# Patient Record
Sex: Male | Born: 1990 | Race: White | Hispanic: Yes | Marital: Married | Smoking: Current every day smoker
Health system: Southern US, Community
[De-identification: ages and names within clinical notes are randomized; demographics above are authoritative.]

## PROBLEM LIST (undated history)

## (undated) DIAGNOSIS — L0591 Pilonidal cyst without abscess: Secondary | ICD-10-CM

## (undated) HISTORY — PX: WISDOM TOOTH EXTRACTION: SHX21

---
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
## 2010-03-17 ENCOUNTER — Ambulatory Visit: Payer: Self-pay | Admitting: Emergency Medicine

## 2010-03-17 DIAGNOSIS — L0591 Pilonidal cyst without abscess: Secondary | ICD-10-CM

## 2010-08-17 NOTE — Assessment & Plan Note (Signed)
Summary: Bump/Boil - Lower back/coccyx x 2-3 dys rm 4   Vital Signs:  Patient Profile:   20 Years Old Male CC:      Bump/boil - Lower back/coccyx x 2-3 dys Height:     64 inches Weight:      177 pounds O2 Sat:      100 % O2 treatment:    Room Air Temp:     97.6 degrees F oral Pulse rate:   91 / minute Pulse rhythm:   regular Resp:     16 per minute BP sitting:   117 / 72  (left arm) Cuff size:   regular  Vitals Entered By: Areta Haber CMA (March 17, 2010 5:15 PM)                  Current Allergies: No known allergies History of Present Illness Chief Complaint: Bump/boil - Lower back/coccyx x 2-3 dys History of Present Illness: Bump on his lower back.  He had a similar lesion in the past that drained spontaneously and he also tried to drain himself.  This one gets worse when he bumps it on something, like he did 2 days ago.  Now with redness, pain, swelling midline lower back.  No F/C/N/V, or other constitutional symptoms.  Not taking any OTC meds.  He is uninsured and voices concern over the cost of treatement.  Current Problems: PILONIDAL CYST (ICD-685.1)   Current Meds BACTRIM DS 800-160 MG TABS (SULFAMETHOXAZOLE-TRIMETHOPRIM) 1 tab by mouth two times a day for 10 days KEFLEX 500 MG CAPS (CEPHALEXIN) 1 tab by mouth two times a day for 10 days  REVIEW OF SYSTEMS Constitutional Symptoms      Denies fever, chills, night sweats, weight loss, weight gain, and fatigue.  Eyes       Denies change in vision, eye pain, eye discharge, glasses, contact lenses, and eye surgery. Ear/Nose/Throat/Mouth       Denies hearing loss/aids, change in hearing, ear pain, ear discharge, dizziness, frequent runny nose, frequent nose bleeds, sinus problems, sore throat, hoarseness, and tooth pain or bleeding.  Respiratory       Denies dry cough, productive cough, wheezing, shortness of breath, asthma, bronchitis, and emphysema/COPD.  Cardiovascular       Denies murmurs, chest pain, and  tires easily with exhertion.    Gastrointestinal       Denies stomach pain, nausea/vomiting, diarrhea, constipation, blood in bowel movements, and indigestion. Genitourniary       Denies painful urination, kidney stones, and loss of urinary control. Neurological       Denies paralysis, seizures, and fainting/blackouts. Musculoskeletal       Denies muscle pain, joint pain, joint stiffness, decreased range of motion, redness, swelling, muscle weakness, and gout.  Skin       Complains of unusual moles/lumps or sores.      Denies bruising and hair/skin or nail changes.      Comments: Lower back/coccyx x 2-3 dys Psych       Denies mood changes, temper/anger issues, anxiety/stress, speech problems, depression, and sleep problems.  Past History:  Past Medical History: Unremarkable  Past Surgical History: Denies surgical history  Social History: Single Current Smoker - 1/2 pack daily Alcohol use-yes - 2 a month Drug use-no Regular exercise-yes Smoking Status:  current Drug Use:  no Does Patient Exercise:  yes Physical Exam General appearance: well developed, well nourished, no acute distress Skin: Central sacral pilonidal cyst, swollen, erythematous, tender.  No active drainage.  No induration, possible mild fluctuance. Assessment New Problems: PILONIDAL CYST (ICD-685.1)   Plan New Medications/Changes: KEFLEX 500 MG CAPS (CEPHALEXIN) 1 tab by mouth two times a day for 10 days  #20 x 0, 03/17/2010, Hoyt Koch MD BACTRIM DS 800-160 MG TABS (SULFAMETHOXAZOLE-TRIMETHOPRIM) 1 tab by mouth two times a day for 10 days  #20 x 0, 03/17/2010, Hoyt Koch MD  New Orders: New Patient Level II [phone] Planning Comments:   Since money is an option and this is a new aggravation of this problem, we can try antibiotics and warm compresses first since this may be more of an irritated pilonidal disease rather than an actual cyst.  If pain continues, if fever, if worsening symptoms,  return to clinic for a vertical I&D incision with packing gauze.  At that time, we do not need to charge an extra copay.  Patient to also use Ibuprofen 400-600mg  Q6 hours as needed pain.  May also need to sit on an inflatable donut.  If it starts draining, do not squeeze and let it drain itself.  Patient understands and agrees to this course of action.   The patient and/or caregiver has been counseled thoroughly with regard to medications prescribed including dosage, schedule, interactions, rationale for use, and possible side effects and they verbalize understanding.  Diagnoses and expected course of recovery discussed and will return if not improved as expected or if the condition worsens. Patient and/or caregiver verbalized understanding.  Prescriptions: KEFLEX 500 MG CAPS (CEPHALEXIN) 1 tab by mouth two times a day for 10 days  #20 x 0   Entered and Authorized by:   Hoyt Koch MD   Signed by:   Hoyt Koch MD on 03/17/2010   Method used:   Print then Give to Patient   RxID:   [id number] BACTRIM DS 800-160 MG TABS (SULFAMETHOXAZOLE-TRIMETHOPRIM) 1 tab by mouth two times a day for 10 days  #20 x 0   Entered and Authorized by:   Hoyt Koch MD   Signed by:   Hoyt Koch MD on 03/17/2010   Method used:   Print then Give to Patient   RxID:   [id number]   Orders Added: 1)  New Patient Level II [97989]  Appended Document: Bump/Boil - Lower back/coccyx x 2-3 dys rm 4 F/U cll to pt - Per recording [phone] has been disconnected or no longer in service.

## 2015-06-16 ENCOUNTER — Ambulatory Visit (INDEPENDENT_AMBULATORY_CARE_PROVIDER_SITE_OTHER): Payer: BLUE CROSS/BLUE SHIELD | Admitting: Family Medicine

## 2015-06-16 ENCOUNTER — Encounter: Payer: Self-pay | Admitting: Family Medicine

## 2015-06-16 VITALS — BP 116/70 | HR 60 | Ht 65.0 in | Wt 182.0 lb

## 2015-06-16 DIAGNOSIS — L0591 Pilonidal cyst without abscess: Secondary | ICD-10-CM | POA: Diagnosis not present

## 2015-06-16 DIAGNOSIS — M26629 Arthralgia of temporomandibular joint, unspecified side: Secondary | ICD-10-CM | POA: Insufficient documentation

## 2015-06-16 DIAGNOSIS — M26622 Arthralgia of left temporomandibular joint: Secondary | ICD-10-CM

## 2015-06-16 DIAGNOSIS — Z23 Encounter for immunization: Secondary | ICD-10-CM | POA: Diagnosis not present

## 2015-06-16 NOTE — Patient Instructions (Signed)
Thank you for coming in today. You were seen today for your ear/jaw pain. 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 We recommend seeing a dentist to have a night guard made. Alternatively you can try an over the counter TMJ bite guard to see if there is improvement.   You were also seen for lower back pain due to a pilonidal cyst. We recommend consulting a surgeon for definitive treatment. We [facility] for [facility]. They should call you, but if you do not hear from them, call the number below.   [phone] [facility]   Please return for worsening or persistent symptoms.

## 2015-06-16 NOTE — Assessment & Plan Note (Signed)
Chronic, persistent. Most likely given recurrent drainage and location. Differential includes sebaceous cysts and abscess, though less likely given consistency of drainage and location as well as years of recurrence. Patient unlikely to benefit from incisional drainage in office given drained completely with palpation. Discussed definitive surgical management and referred to [facility].

## 2015-06-16 NOTE — Assessment & Plan Note (Signed)
Most likely source of jaw/ear pain given click with opening of jaw and history of grinding teeth. Recommend over the counter night guard for initial management with professional TMJ night guard made by dentist as the next step. Counseled on NSAID use to address acute pain. Follow up with persistent pain.

## 2015-06-16 NOTE — Progress Notes (Signed)
Collin SpenceLuis I Pace is a 24 y.o. male who [facility]  today for L ear pain and pilonidal cyst.   1) ear pain: Patient first noted L ear pain 2-3 weeks ago. He describes this as an itchy sensation within the ear. Concurrently he noted his jaw was locking up and he is unsure if the pain is in the ear or jaw. He notes this pain has been intermittent sharp pain anterior to the left ear every 4-5 hours. He notes he grinds his teeth at night and has not seen a dentist in 4-5 years. Pain at worst 3/10 but today 2/10, He has tried antibiotics but nothing else. Denies fevers, chills, drainage, and chewing gum.   2) pilonidal cyst: first noted 4-5 years ago. Notes that this cyst has always persists but will occasionally pop every 1-2 months when he bends over or bumps up against something. He notes it will hurt when it pops, and tracks into the lower lumbar area. He notes the cyst emits yellow, brown fluid. Patient notes it last popped a week ago. He received antibiotics for this 1 month ago, which helped it drain, but the cyst has recurred.    History reviewed. No pertinent past medical history. History reviewed. No pertinent past surgical history. Social History  Substance Use Topics  . Smoking status: Current Every Day Smoker  . Smokeless tobacco: Not on file  . Alcohol Use: 0.0 oz/week    0 Standard drinks or equivalent per week   family history is not on file.  ROS as above Medications: No current outpatient prescriptions on file.   No current facility-administered medications for this visit.   No Known Allergies   Exam:  BP 116/70 mmHg  Pulse 60  Ht 5\' 5"  (1.651 m)  Wt 182 lb (82.555 kg)  BMI 30.29 kg/m2 Gen: Well, non toxic man in NAD seated on exam table  HEENT: EOMI,  MMM, normal dentition with no sign of abscess or acute infection, non erythematous. Palpable click on left side with reproducible pain with opening mouth. Bilateral  clear external auditory canals, TM gray, translucent, bony landmarks appreciated, non-retracted/bulging, no erythema Lungs: Normal work of breathing Heart: RRR  Abd: Soft. Nondistended, Nontender Exts: Brisk capillary refill, warm and well perfused.  Skin: 1 cm in diameter papule 1cm left of the superior margin of the gluteal cleft with central punctutate lesion expressing brown pus when palpated superoinferiorly. Scarring noted at superior gluteal cleft.  The area is not particularly tender and there is no significant spreading erythema or induration. The fluid is small and drains well.  No results found for this or any previous visit (from the past 24 hour(s)). No results found.   Please see individual assessment and plan sections.

## 2015-12-21 ENCOUNTER — Encounter (HOSPITAL_COMMUNITY): Payer: Self-pay | Admitting: *Deleted

## 2015-12-23 NOTE — Progress Notes (Signed)
Contacted pt to review medication list following wisdom teeth extraction on 12/22/2015. Discussed with pt okay with a sip of water morning of surgery can take methylprednisone as prescribed, cephalaxen as prescribed, and hydrocodone-acetaminophen if needed. Pt verbalized understanding. Also aware of no food or drink after midnight.

## 2015-12-23 NOTE — H&P (Signed)
Collin Pace  Location: [facility] Patient #: [id number] DOB: [birth year] Married / Language: English / Race: Native Hawaiian or Other Pacific Islander Male  History of Present Illness   Patient words: reck.  The patient is a 25 year old male who presents with a pilonidal cyst.  His PCP is Dr. Denyse [facility] He is by himself.   He was supposed to call at the first of the year to schedule surgery, however there is some mixup which is unclear to me. He has had a couple flareups since I last saw him. He is now ready to schedule surgery. I gave him another sheet on pilonidal cystectomies. I explained the open procedure. I talked about the length of recovery which may take 6-12 weeks for the wound to heal. I also talked about possibly shaving the skin around the wound as it heals.          I reviewed with him the risk of surgery:  Infection, bleeding, nerve injury, and recurrence of the pilonidal.  History of pilonidal cyst (06/2015): The patient has had symptoms of a intergluteal knot and drainage from between his buttocks for about 5 years. This will go through a cycle of swelling up, then draining, then getting better. The cycle goes beteen several weeks to several months. He has personally mess with this area but has had no surgery on the pilonidal cyst.  Past Medical History: 1. Smokes - he knows that this is bad for his health.  Social History: Married. 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[place] Has 25 yo   Problem List/Past Medical (Kandis Cockingavid H Nikkolas Coomes, MD; 11/12/2015 1:47 PM) PILONIDAL CYST (L05.91)  Allergies (Sonya Bynum, CMA; 11/12/2015 4:20 PM) No Known Drug Allergies12/14/2016  Medication History (Sonya Bynum, CMA; 11/12/2015 4:20 PM) No Current Medications Medications Reconciled  Vitals (Sonya Bynum CMA; 11/12/2015 4:20 PM) 11/12/2015 4:20 PM Weight: 178 lb Height: 65in Body Surface Area: 1.88 m Body Mass Index:  29.62 kg/m  Temp.: 77F(Temporal)  Pulse: 75 (Regular)  BP: 118/70 (Sitting, Left Arm, Standard)   Physical Exam  General: WN M alert and generally healthy appearing. A little quiet. HEENT: Normal. Pupils equal.  Neck: Supple. No mass. No thyroid mass.   Abdomen: Soft. No mass. No tenderness. No hernia. Normal bowel sounds. No abdominal scars.  Intragluteal area. Several puctums covering almost 10 cm. It looks longer than what I remembe when I last saw him.  I drew the length of the incision (punctums) for him, so he is aware of how large the incision is going to be  Extremities: Good strength and ROM in upper and lower extremities.  Neurologic: Grossly intact to motor and sensory function. Psychiatric: Has normal mood and affect. Behavior is normal.   Assessment & Plan  1.  PILONIDAL CYST (L05.91)  Plan:   1) To schedule pilonidal cyst excision  2. Smokes - he knows that this is bad for his health.  We talked about this delaying wound healing and possibly contributing to recurrence of the pilonidal.  Ovidio Kinavid Torianna Junio, MD, [facility] Pager: [phone] Office phone:  [phone]

## 2015-12-24 ENCOUNTER — Ambulatory Visit (HOSPITAL_COMMUNITY)
Admission: RE | Admit: 2015-12-24 | Discharge: 2015-12-24 | Disposition: A | Discharge status: Home / Self Care | Payer: BLUE CROSS/BLUE SHIELD | Source: Ambulatory Visit | Attending: Surgery | Admitting: Surgery

## 2015-12-24 ENCOUNTER — Encounter (HOSPITAL_COMMUNITY): Payer: Self-pay

## 2015-12-24 ENCOUNTER — Ambulatory Visit (HOSPITAL_COMMUNITY): Payer: BLUE CROSS/BLUE SHIELD | Admitting: Registered Nurse

## 2015-12-24 ENCOUNTER — Ambulatory Visit: Payer: Self-pay | Admitting: Surgery

## 2015-12-24 ENCOUNTER — Encounter (HOSPITAL_COMMUNITY)
Admission: RE | Disposition: A | Discharge status: Home / Self Care | Payer: Self-pay | Source: Ambulatory Visit | Attending: Surgery

## 2015-12-24 DIAGNOSIS — L0591 Pilonidal cyst without abscess: Secondary | ICD-10-CM | POA: Diagnosis present

## 2015-12-24 DIAGNOSIS — F17200 Nicotine dependence, unspecified, uncomplicated: Secondary | ICD-10-CM | POA: Insufficient documentation

## 2015-12-24 HISTORY — DX: Pilonidal cyst without abscess: L05.91

## 2015-12-24 HISTORY — PX: PILONIDAL CYST EXCISION: SHX744

## 2015-12-24 LAB — HEMOGLOBIN: Hemoglobin: 13.8 g/dL (ref 13.0–17.0)

## 2015-12-24 SURGERY — EXCISION, PILONIDAL CYST, EXTENSIVE
Anesthesia: General

## 2015-12-24 MED ORDER — HYDROCODONE-ACETAMINOPHEN 5-325 MG PO TABS: 1.0000 | ORAL_TABLET | Freq: Four times a day (QID) | ORAL | Status: AC | PRN

## 2015-12-24 MED ORDER — CEFAZOLIN SODIUM-DEXTROSE 2-4 GM/100ML-% IV SOLN
2.0000 g | INTRAVENOUS | Status: AC
  Administered 2015-12-24: 2 g via INTRAVENOUS
  Filled 2015-12-24: qty 100

## 2015-12-24 MED ORDER — CHLORHEXIDINE GLUCONATE 4 % EX LIQD: 60.0000 mL | Freq: Once | CUTANEOUS | Status: DC

## 2015-12-24 MED ORDER — FENTANYL CITRATE (PF) 250 MCG/5ML IJ SOLN
INTRAMUSCULAR | Status: AC
  Filled 2015-12-24: qty 5

## 2015-12-24 MED ORDER — HYDROCODONE-ACETAMINOPHEN 5-325 MG PO TABS
1.0000 | ORAL_TABLET | ORAL | Status: DC | PRN
  Administered 2015-12-24: 1 via ORAL
  Filled 2015-12-24: qty 1

## 2015-12-24 MED ORDER — BUPIVACAINE HCL (PF) 0.25 % IJ SOLN
INTRAMUSCULAR | Status: DC | PRN
  Administered 2015-12-24: 30 mL

## 2015-12-24 MED ORDER — METHYLENE BLUE 0.5 % INJ SOLN
INTRAVENOUS | Status: AC
  Filled 2015-12-24: qty 10

## 2015-12-24 MED ORDER — PROPOFOL 10 MG/ML IV BOLUS
INTRAVENOUS | Status: AC
Start: 2015-12-24 — End: 2015-12-24
  Filled 2015-12-24: qty 20

## 2015-12-24 MED ORDER — METHYLENE BLUE (ANTIDOTE) 1 % IJ SOLN
INTRAMUSCULAR | Status: DC | PRN
  Administered 2015-12-24: 1 mL via INTRAMUSCULAR

## 2015-12-24 MED ORDER — PROPOFOL 10 MG/ML IV BOLUS
INTRAVENOUS | Status: DC | PRN
  Administered 2015-12-24: 160 mg via INTRAVENOUS
  Administered 2015-12-24: 50 mg via INTRAVENOUS

## 2015-12-24 MED ORDER — SUGAMMADEX SODIUM 200 MG/2ML IV SOLN
INTRAVENOUS | Status: AC
  Filled 2015-12-24: qty 2

## 2015-12-24 MED ORDER — SUCCINYLCHOLINE CHLORIDE 20 MG/ML IJ SOLN
INTRAMUSCULAR | Status: DC | PRN
  Administered 2015-12-24: 100 mg via INTRAVENOUS

## 2015-12-24 MED ORDER — ONDANSETRON HCL 4 MG/2ML IJ SOLN
INTRAMUSCULAR | Status: DC | PRN
  Administered 2015-12-24: 4 mg via INTRAVENOUS

## 2015-12-24 MED ORDER — LIDOCAINE HCL (CARDIAC) 20 MG/ML IV SOLN
INTRAVENOUS | Status: AC
  Filled 2015-12-24: qty 5

## 2015-12-24 MED ORDER — LACTATED RINGERS IV SOLN
INTRAVENOUS | Status: DC | PRN
  Administered 2015-12-24: 10:00:00 via INTRAVENOUS

## 2015-12-24 MED ORDER — 0.9 % SODIUM CHLORIDE (POUR BTL) OPTIME
TOPICAL | Status: DC | PRN
Start: 2015-12-24 — End: 2015-12-24
  Administered 2015-12-24: 1000 mL

## 2015-12-24 MED ORDER — MIDAZOLAM HCL 5 MG/5ML IJ SOLN
INTRAMUSCULAR | Status: DC | PRN
  Administered 2015-12-24: 2 mg via INTRAVENOUS

## 2015-12-24 MED ORDER — SUGAMMADEX SODIUM 200 MG/2ML IV SOLN
INTRAVENOUS | Status: DC | PRN
Start: 2015-12-24 — End: 2015-12-24
  Administered 2015-12-24: 160 mg via INTRAVENOUS

## 2015-12-24 MED ORDER — MIDAZOLAM HCL 2 MG/2ML IJ SOLN
INTRAMUSCULAR | Status: AC
  Filled 2015-12-24: qty 2

## 2015-12-24 MED ORDER — ROCURONIUM BROMIDE 50 MG/5ML IV SOLN
INTRAVENOUS | Status: AC
  Filled 2015-12-24: qty 1

## 2015-12-24 MED ORDER — FENTANYL CITRATE (PF) 100 MCG/2ML IJ SOLN: 25.0000 ug | INTRAMUSCULAR | Status: DC | PRN

## 2015-12-24 MED ORDER — PROPOFOL 10 MG/ML IV BOLUS
INTRAVENOUS | Status: AC
  Filled 2015-12-24: qty 20

## 2015-12-24 MED ORDER — BUPIVACAINE HCL (PF) 0.25 % IJ SOLN
INTRAMUSCULAR | Status: AC
  Filled 2015-12-24: qty 30

## 2015-12-24 MED ORDER — CEFAZOLIN SODIUM-DEXTROSE 2-4 GM/100ML-% IV SOLN
INTRAVENOUS | Status: AC
Start: 2015-12-24 — End: 2015-12-24
  Filled 2015-12-24: qty 100

## 2015-12-24 MED ORDER — LIDOCAINE HCL (CARDIAC) 20 MG/ML IV SOLN
INTRAVENOUS | Status: DC | PRN
  Administered 2015-12-24: 100 mg via INTRAVENOUS

## 2015-12-24 MED ORDER — ONDANSETRON HCL 4 MG/2ML IJ SOLN
INTRAMUSCULAR | Status: AC
  Filled 2015-12-24: qty 2

## 2015-12-24 MED ORDER — ROCURONIUM BROMIDE 100 MG/10ML IV SOLN
INTRAVENOUS | Status: DC | PRN
  Administered 2015-12-24: 20 mg via INTRAVENOUS

## 2015-12-24 MED ORDER — FENTANYL CITRATE (PF) 100 MCG/2ML IJ SOLN
INTRAMUSCULAR | Status: DC | PRN
  Administered 2015-12-24 (×2): 100 ug via INTRAVENOUS

## 2015-12-24 SURGICAL SUPPLY — 28 items
BLADE CLIPPER SURG (BLADE) ×3 IMPLANT
BNDG GAUZE ELAST 4 BULKY (GAUZE/BANDAGES/DRESSINGS) ×3 IMPLANT
COVER SURGICAL LIGHT HANDLE (MISCELLANEOUS) IMPLANT
DRAPE LAPAROTOMY T 102X78X121 (DRAPES) ×3 IMPLANT
ELECT REM PT RETURN 9FT ADLT (ELECTROSURGICAL) ×3
ELECTRODE REM PT RTRN 9FT ADLT (ELECTROSURGICAL) ×1 IMPLANT
GAUZE SPONGE 4X4 12PLY STRL (GAUZE/BANDAGES/DRESSINGS) ×3 IMPLANT
GLOVE BIO SURGEON STRL SZ7 (GLOVE) IMPLANT
GLOVE BIO SURGEON STRL SZ7.5 (GLOVE) ×3 IMPLANT
GLOVE BIOGEL PI IND STRL 7.0 (GLOVE) IMPLANT
GLOVE BIOGEL PI IND STRL 7.5 (GLOVE) ×1 IMPLANT
GLOVE BIOGEL PI INDICATOR 7.0 (GLOVE)
GLOVE BIOGEL PI INDICATOR 7.5 (GLOVE) ×2
GLOVE EUDERMIC 7 POWDERFREE (GLOVE) IMPLANT
GLOVE SURG SIGNA 7.5 PF LTX (GLOVE) ×3 IMPLANT
GOWN SPEC L4 XLG W/TWL (GOWN DISPOSABLE) ×3 IMPLANT
GOWN STRL REUS W/ TWL XL LVL3 (GOWN DISPOSABLE) ×3 IMPLANT
GOWN STRL REUS W/TWL LRG LVL3 (GOWN DISPOSABLE) ×3 IMPLANT
GOWN STRL REUS W/TWL XL LVL3 (GOWN DISPOSABLE) ×9
KIT BASIN OR (CUSTOM PROCEDURE TRAY) ×3 IMPLANT
NEEDLE BLUNT 17GA (NEEDLE) ×3 IMPLANT
NEEDLE HYPO 22GX1.5 SAFETY (NEEDLE) ×3 IMPLANT
PACK GENERAL/GYN (CUSTOM PROCEDURE TRAY) ×3 IMPLANT
PAD ABD 8X10 STRL (GAUZE/BANDAGES/DRESSINGS) ×3 IMPLANT
SYR CONTROL 10ML LL (SYRINGE) ×6 IMPLANT
TAPE CLOTH SURG 4X10 WHT LF (GAUZE/BANDAGES/DRESSINGS) ×3 IMPLANT
TOWEL OR 17X26 10 PK STRL BLUE (TOWEL DISPOSABLE) ×3 IMPLANT
UNDERPAD 30X30 INCONTINENT (UNDERPADS AND DIAPERS) ×3 IMPLANT

## 2015-12-24 NOTE — Discharge Instructions (Signed)
[facility] - DISCHARGE INSTRUCTIONS TO PATIENT  Return to work on:  Will talk about this when I see him back  Activity:  Driving - May drive in 3 or 4 days, if doing well.  It taking pain meds, should not drive   Lifting - No lifting more than 15 pounds for 1 week, then no limit  Wound Care:   Remove packing tomorrow and start sitz bath.  Do the sitz baths at least twice a day.        Sitz bath is sitting in warm water in tub for 15 minutes (the water should be a comfortable temperature)  Diet:  As tolerated.  Follow up appointment:  Call Dr. Allene PyoNewman's office [facility] at [phone] for an appointment in next week.  Medications and dosages:  Resume your home medications.  You have a prescription for:  Vicodin.  Call Dr. Ezzard StandingNewman or his office  [phone]) if you have:  Temperature greater than 100.4,  Persistent nausea and vomiting,  Severe uncontrolled pain,  Redness, tenderness, or signs of infection (pain, swelling, redness, odor or green/yellow discharge around the site),  Difficulty breathing, headache or visual disturbances,  Any other questions or concerns you may have after discharge.  In an emergency, call 911 or go to an Emergency Department at a nearby hospital.   General Anesthesia, Adult, Care After Refer to this sheet in the next few weeks. These instructions provide you with information on caring for yourself after your procedure. Your health care provider may also give you more specific instructions. Your treatment has been planned according to current medical practices, but problems sometimes occur. Call your health care provider if you have any problems or questions after your procedure. WHAT TO EXPECT AFTER THE PROCEDURE After the procedure, it is typical to experience:  Sleepiness.  Nausea and vomiting. HOME CARE INSTRUCTIONS  For the first 24 hours after general anesthesia:  Have a responsible person with you.  Do not  drive a car. If you are alone, do not take public transportation.  Do not drink alcohol.  Do not take medicine that has not been prescribed by your health care provider.  Do not sign important papers or make important decisions.  You may resume a normal diet and activities as directed by your health care provider.  If you have questions or problems that seem related to general anesthesia, call the hospital and ask for the anesthetist or anesthesiologist on call. SEEK MEDICAL CARE IF:  You have nausea and vomiting that continue the day after anesthesia.  You develop a rash. SEEK IMMEDIATE MEDICAL CARE IF:   You have difficulty breathing.  You have chest pain.  You have any allergic problems.   This information is not intended to replace advice given to you by your health care provider. Make sure you discuss any questions you have with your health care provider.   Document Released: 10/10/2000 Document Revised: 07/25/2014 Document Reviewed: 11/02/2011 Elsevier Interactive Patient Education [employer]2016 Elsevier Inc.

## 2015-12-24 NOTE — Anesthesia Preprocedure Evaluation (Addendum)
Anesthesia Evaluation  Patient identified by MRN, date of birth, ID band Patient awake    Reviewed: Allergy & Precautions, H&P , Patient's Chart, lab work & pertinent test results, reviewed documented beta blocker date and time   Airway Mallampati: II  TM Distance: >3 FB Neck ROM: full    Dental no notable dental hx.    Pulmonary Current Smoker,    Pulmonary exam normal breath sounds clear to auscultation       Cardiovascular  Rhythm:regular Rate:Normal     Neuro/Psych    GI/Hepatic   Endo/Other    Renal/GU      Musculoskeletal   Abdominal   Peds  Hematology   Anesthesia Other Findings   Reproductive/Obstetrics                           Anesthesia Physical Anesthesia Plan  ASA: II  Anesthesia Plan: General   Post-op Pain Management:    Induction: Intravenous  Airway Management Planned: Oral ETT and Video Laryngoscope Planned  Additional Equipment:   Intra-op Plan:   Post-operative Plan: Extubation in OR  Informed Consent: I have reviewed the patients History and Physical, chart, labs and discussed the procedure including the risks, benefits and alternatives for the proposed anesthesia with the patient or authorized representative who has indicated his/her understanding and acceptance.   Dental Advisory Given and Dental advisory given  Plan Discussed with: CRNA and Surgeon  Anesthesia Plan Comments: (Recently had his wisdom teeth removed; will have glidescope available. Aperture adequate  Discussed general anesthesia, including possible nausea, instrumentation of airway, sore throat,pulmonary aspiration, etc. I asked if the were any outstanding questions, or  concerns before we proceeded. )       Anesthesia Quick Evaluation

## 2015-12-24 NOTE — Op Note (Signed)
12/24/2015  11:49 AM  PATIENT:  Collin Pace, 25 y.o., male, MRN: [id number]  PREOP DIAGNOSIS:  Pilonidal cyst   POSTOP DIAGNOSIS:   Pilonidal cyst  (photos at the end of then note)  PROCEDURE:   Procedure(s):  PILONIDAL CYSTECTOMY   SURGEON:   Ovidio Kinavid Brighton Delio, M.D.  ASSISTANT:   None  ANESTHESIA:   general  Anesthesiologist: Cristela BlueKyle Jackson, MD CRNA: Elisabeth CaraLacey A Armistead, CRNA  General  EBL:  75  ml  DRAINS: none   LOCAL MEDICATIONS USED:   30 cc of 1/4% Marcaine  SPECIMEN:   Pilonidal cyst  COUNTS CORRECT:  YES  INDICATIONS FOR PROCEDURE:  Collin Pace is a 25 y.o. (DOB: [birth year])  male whose primary care physician is Clementeen GrahamEvan Corey, MD and comes for excision of pilonidal cyst.   The indications and risks of the surgery were explained to the patient.  The risks include, but are not limited to, infection, bleeding, and nerve injury.  He understands that I will leave the wound open.  PROCEDURE:   The patient [facility] and given a general anesthetic.  He was rolled to a prone position.  His buttocks was shaved and he was his buttocks was taped apart.  A time out was held and the surgical check list run.  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  The cranial part of the wound and the left side of the wound had had prior infections.  I injected the sinus with methyline blue.  I thin excised the tract/sinus in its entirety.  I did not cut across the tract.  I irrigated the wound with saline.  I infiltrated 30 cc of 1/4% marcaine into the wound.  I then waited 15 minutes with packing in the wound to allow any bleeding or problems.  There was none.  I then packed the wound with saline gauze.  He was transferred to the recovery room in good condition.  The sponge and needle count were correct at the end of the case.    Pre op pilonidal puncta   Post op wound    Ovidio Kinavid Nohea Kras, MD, [facility] Pager:  [phone] Office phone:  [phone]

## 2015-12-24 NOTE — Anesthesia Procedure Notes (Signed)
Procedure Name: Intubation Date/Time: 12/24/2015 10:30 AM Performed by: Jarvis NewcomerARMISTEAD, Collin Pace Pre-anesthesia Checklist: Patient identified, Emergency Drugs available, Suction available, Patient being monitored and Timeout performed Patient Re-evaluated:Patient Re-evaluated prior to inductionOxygen Delivery Method: Circle system utilized Preoxygenation: Pre-oxygenation with 100% oxygen Intubation Type: IV induction Laryngoscope Size: Miller and 2 Grade View: Grade I Tube type: Oral Tube size: 7.5 mm Number of attempts: 1 Airway Equipment and Method: Stylet Placement Confirmation: ETT inserted through vocal cords under direct vision,  positive ETCO2 and breath sounds checked- equal and bilateral Secured at: 23 cm Tube secured with: Tape Dental Injury: Teeth and Oropharynx as per pre-operative assessment

## 2015-12-24 NOTE — Interval H&P Note (Signed)
History and Physical Interval Note:  12/24/2015 10:05 AM  Collin Pace  has presented today for surgery, with the diagnosis of Pilonidal cyst   The various methods of treatment have been discussed with the patient and family.  Wife is with him.  We discussed returning to work.  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    After consideration of risks, benefits and other options for treatment, the patient has consented to  Procedure(s):  PILONIDAL CYSTECTOMY  (N/A) as a surgical intervention .  The patient's history has been reviewed, patient examined, no change in status, stable for surgery.  I have reviewed the patient's chart and labs.  Questions were answered to the patient's satisfaction.     Julieth Tugman H

## 2015-12-24 NOTE — Transfer of Care (Signed)
Immediate Anesthesia Transfer of Care Note  Patient: Collin Pace  Procedure(s) Performed: Procedure(s):  PILONIDAL CYSTECTOMY  (N/A)  Patient Location: PACU  Anesthesia Type:General  Level of Consciousness: awake, alert , oriented and patient cooperative  Airway & Oxygen Therapy: Patient Spontanous Breathing and Patient connected to face mask oxygen  Post-op Assessment: Report given to RN, Post -op Vital signs reviewed and stable and Patient moving all extremities  Post vital signs: Reviewed and stable  Last Vitals:  Filed Vitals:   12/24/15 0837  BP: 109/68  Pulse: 51  Temp: 36.6 C  Resp: 16    Last Pain:  Filed Vitals:   12/24/15 0912  PainSc: 3       Patients Stated Pain Goal: 4 (12/24/15 0908)  Complications: No apparent anesthesia complications

## 2015-12-24 NOTE — Anesthesia Postprocedure Evaluation (Signed)
Anesthesia Post Note  Patient: Collin Pace  Procedure(s) Performed: Procedure(s) (LRB):  PILONIDAL CYSTECTOMY  (N/A)  Patient location during evaluation: PACU Anesthesia Type: General Level of consciousness: sedated Pain management: satisfactory to patient Vital Signs Assessment: post-procedure vital signs reviewed and stable Respiratory status: spontaneous breathing Cardiovascular status: stable Anesthetic complications: no    Last Vitals:  Filed Vitals:   12/24/15 1300 12/24/15 1315  BP: 107/69 105/58  Pulse: 57 52  Temp: 36.8 C 36.7 C  Resp: 15 14    Last Pain:  Filed Vitals:   12/24/15 1316  PainSc: 0-No pain                 Rocio Wolak EDWARD

## 2015-12-25 MED FILL — Methylene Blue IV Soln 50 MG/10ML (5 MG/ML): INTRAVENOUS | Qty: 10 | Status: AC

## 2016-01-27 ENCOUNTER — Encounter (HOSPITAL_COMMUNITY): Payer: Self-pay | Admitting: *Deleted

## 2016-01-27 ENCOUNTER — Emergency Department (HOSPITAL_COMMUNITY)
Admission: EM | Admit: 2016-01-27 | Discharge: 2016-01-27 | Disposition: A | Discharge status: Home / Self Care | Payer: BLUE CROSS/BLUE SHIELD | Attending: Emergency Medicine | Admitting: Emergency Medicine

## 2016-01-27 DIAGNOSIS — Y939 Activity, unspecified: Secondary | ICD-10-CM | POA: Diagnosis not present

## 2016-01-27 DIAGNOSIS — F17210 Nicotine dependence, cigarettes, uncomplicated: Secondary | ICD-10-CM | POA: Insufficient documentation

## 2016-01-27 DIAGNOSIS — Y999 Unspecified external cause status: Secondary | ICD-10-CM | POA: Insufficient documentation

## 2016-01-27 DIAGNOSIS — S29012A Strain of muscle and tendon of back wall of thorax, initial encounter: Secondary | ICD-10-CM | POA: Insufficient documentation

## 2016-01-27 DIAGNOSIS — Y92410 Unspecified street and highway as the place of occurrence of the external cause: Secondary | ICD-10-CM | POA: Diagnosis not present

## 2016-01-27 DIAGNOSIS — S299XXA Unspecified injury of thorax, initial encounter: Secondary | ICD-10-CM | POA: Diagnosis present

## 2016-01-27 DIAGNOSIS — T148XXA Other injury of unspecified body region, initial encounter: Secondary | ICD-10-CM

## 2016-01-27 MED ORDER — CYCLOBENZAPRINE HCL 10 MG PO TABS: 10.0000 mg | ORAL_TABLET | Freq: Two times a day (BID) | ORAL | Status: AC | PRN

## 2016-01-27 NOTE — Discharge Instructions (Signed)
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
## 2016-01-27 NOTE — ED Notes (Signed)
Declined W/C at D/C and was escorted to lobby by RN. 

## 2020-07-30 ENCOUNTER — Encounter (HOSPITAL_COMMUNITY): Payer: Self-pay | Admitting: Emergency Medicine

## 2020-07-30 ENCOUNTER — Emergency Department (HOSPITAL_COMMUNITY): Payer: No Typology Code available for payment source

## 2020-07-30 ENCOUNTER — Emergency Department (HOSPITAL_COMMUNITY)
Admission: EM | Admit: 2020-07-30 | Discharge: 2020-07-31 | Disposition: A | Discharge status: Home / Self Care | Payer: No Typology Code available for payment source | Attending: Emergency Medicine | Admitting: Emergency Medicine

## 2020-07-30 ENCOUNTER — Other Ambulatory Visit: Payer: Self-pay

## 2020-07-30 DIAGNOSIS — S62024A Nondisplaced fracture of middle third of navicular [scaphoid] bone of right wrist, initial encounter for closed fracture: Secondary | ICD-10-CM | POA: Insufficient documentation

## 2020-07-30 DIAGNOSIS — F17210 Nicotine dependence, cigarettes, uncomplicated: Secondary | ICD-10-CM | POA: Insufficient documentation

## 2020-07-30 DIAGNOSIS — Y990 Civilian activity done for income or pay: Secondary | ICD-10-CM | POA: Insufficient documentation

## 2020-07-30 DIAGNOSIS — W208XXA Other cause of strike by thrown, projected or falling object, initial encounter: Secondary | ICD-10-CM | POA: Diagnosis not present

## 2020-07-30 DIAGNOSIS — S6991XA Unspecified injury of right wrist, hand and finger(s), initial encounter: Secondary | ICD-10-CM | POA: Diagnosis present

## 2020-07-30 NOTE — ED Provider Notes (Signed)
[facility] EMERGENCY DEPARTMENT Provider Note   CSN: [id number] Arrival date & time: 07/30/20  1841     History Chief Complaint  Patient presents with  . Wrist Pain    Collin Pace is a 30 y.o. male.  HPI 30 year old male presents to the emergency department today for evaluation of right wrist pain.  Patient reports 2 years ago he had an injury at work where something fell onto the right wrist.  Patient reports he just took ibuprofen and did not get imaging at that time.  Patient reports that certain movements makes the pain worse.  No active pain at this time.  Denies any numbness or tingling.  Is taken no medications for pain prior to arrival today.  Patient has been wearing a splint at times that does not seem to help with the symptoms. No new injuries.    Past Medical History:  Diagnosis Date  . Pilonidal cyst     Patient Active Problem List   Diagnosis Date Noted  . Temporomandibular joint (TMJ) pain 06/16/2015  . Pilonidal cyst 03/17/2010    Past Surgical History:  Procedure Laterality Date  . PILONIDAL CYST EXCISION N/A 12/24/2015   Procedure:  PILONIDAL CYSTECTOMY ;  Surgeon: Ovidio Kin, MD;  Location: [facility];  Service: General;  Laterality: N/A;  . WISDOM TOOTH EXTRACTION     4-to be removed on 12/22/2015       No family history on file.  Social History   Tobacco Use  . Smoking status: Current Every Day Smoker    Packs/day: 0.50    Years: 8.00    Pack years: 4.00    Types: Cigarettes  . Smokeless tobacco: Current User    Types: Snuff  Substance Use Topics  . Alcohol use: Yes    Alcohol/week: 0.0 standard drinks    Comment: 1 weekend a month  . Drug use: No    Home Medications Prior to Admission medications   Medication Sig Start Date End Date Taking? Authorizing Provider  cephALEXin (KEFLEX) 500 MG capsule Take 1,000 mg by mouth daily.    [provider]  cyclobenzaprine (FLEXERIL) 10 MG tablet Take 1 tablet  (10 mg total) by mouth 2 (two) times daily as needed for muscle spasms. 01/27/16   Roxy Horseman, PA-C  HYDROcodone-acetaminophen (NORCO) 7.5-325 MG tablet Take 1 tablet by mouth every 4 (four) hours as needed for moderate pain (can take every 4 to 6 hours as needed).    [provider]  HYDROcodone-acetaminophen (NORCO/VICODIN) 5-325 MG tablet Take 1-2 tablets by mouth every 6 (six) hours as needed. 12/24/15   Ovidio Kin, MD  ibuprofen (ADVIL,MOTRIN) 800 MG tablet Take 800 mg by mouth every 8 (eight) hours as needed (can take every 6 to 8 hours as needed for pain).    [provider]  methylPREDNISolone (MEDROL DOSEPAK) 4 MG TBPK tablet Take 4 mg by mouth. 12/23/15   [provider]    Allergies    Patient has no known allergies.  Review of Systems   Review of Systems  Constitutional: Negative for chills and fever.  HENT: Negative for congestion.   Eyes: Negative for discharge.  Respiratory: Negative for cough.   Gastrointestinal: Negative for vomiting.  Musculoskeletal: Positive for arthralgias and myalgias. Negative for joint swelling.  Skin: Negative for color change.  Neurological: Negative for weakness and numbness.    Physical Exam Updated Vital Signs BP 118/78 (BP Location: Left Arm)   Pulse 73  Temp 98.6 F (37 C) (Oral)   Resp 16   Ht 5\' 5"  (1.651 m)   Wt 81.6 kg   SpO2 99%   BMI 29.95 kg/m   Physical Exam Vitals and nursing note reviewed.  Constitutional:      General: He is not in acute distress.    Appearance: He is well-developed and well-nourished. He is not ill-appearing or toxic-appearing.  HENT:     Head: Normocephalic and atraumatic.  Eyes:     General: No scleral icterus.       Right eye: No discharge.        Left eye: No discharge.  Cardiovascular:     Pulses: Normal pulses.  Pulmonary:     Effort: No respiratory distress.  Musculoskeletal:        General: Normal range of motion.     Cervical back: Normal range of  motion.     Comments: Patient has full range of motion of the right wrist.  Does have some pain with extension of the wrist.  Radial pulses are 2+.  Negative Tinel's sign.  No erythema or edema noted.  No warmth.  No ecchymosis.  Skin:    Coloration: Skin is not pale.  Neurological:     Mental Status: He is alert.     Sensory: No sensory deficit.     Motor: No weakness.  Psychiatric:        Behavior: Behavior normal.        Thought Content: Thought content normal.        Judgment: Judgment normal.     ED Results / Procedures / Treatments   Labs (all labs ordered are listed, but only abnormal results are displayed) Labs Reviewed - No data to display  EKG None  Radiology DG Wrist Complete Right  Result Date: 07/30/2020 CLINICAL DATA:  Fall, wrist pain EXAM: RIGHT WRIST - COMPLETE 3+ VIEW COMPARISON:  None. FINDINGS: There is a fracture through the mid pole of the right scaphoid. No subluxation or dislocation. No additional fracture. Soft tissues are intact. IMPRESSION: Midpole scaphoid fracture. Electronically Signed   By: 08/01/2020 M.D.   On: 07/30/2020 23:55    Procedures Procedures (including critical care time)  Medications Ordered in ED Medications - No data to display  ED Course  I have reviewed the triage vital signs and the nursing notes.  Pertinent labs & imaging results that were available during my care of the patient were reviewed by me and considered in my medical decision making (see chart for details).    MDM Rules/Calculators/A&P                          30 year old male presents the ER for intermittent right wrist pain.  Patient reports that he injured his wrist in an accident about a year and a half ago.  Never received any imaging at that time.  Patient reports since then has had intermittent pain with certain movements.  Patient neurovascularly intact.  X-rays reviewed myself today.  Patient does have a scaphoid fracture.  Patient placed into a thumb  spica splint.  Given hand follow-up.  Discussed splint care at home.  Discussed return precautions with patient.  Pt is hemodynamically stable, in NAD, & able to ambulate in the ED. Evaluation does not show pathology that would require ongoing emergent intervention or inpatient treatment. I explained the diagnosis to the patient. Pain has been managed & has no complaints prior  to dc. Pt is comfortable with above plan and is stable for discharge at this time. All questions were answered prior to disposition. Strict return precautions for f/u to the ED were discussed. Encouraged follow up with PCP.  Final Clinical Impression(s) / ED Diagnoses Final diagnoses:  Closed nondisplaced fracture of middle third of scaphoid bone of right wrist, initial encounter    Rx / DC Orders ED Discharge Orders    None       Rise Mu, PA-C 07/31/20 0014    Gilda Crease, MD 07/31/20 [phone]

## 2020-07-30 NOTE — ED Triage Notes (Addendum)
Patient complains of wrist pain that started approximately two years ago after an incident at work. States the wrist has bothered him since the incident. Denies any new or recent injury.

## 2020-07-31 NOTE — ED Notes (Signed)
Ortho tech called 

## 2020-07-31 NOTE — ED Notes (Signed)
Patient verbalizes understanding of discharge instructions. Opportunity for questioning and answers were provided. Armband removed by staff, pt discharged from ED ambulatory.   

## 2020-07-31 NOTE — Progress Notes (Signed)
Orthopedic Tech Progress Note Patient Details:  WES LEZOTTE 1990-08-11 [id number]  Ortho Devices Type of Ortho Device: Thumb spica splint Ortho Device/Splint Location: rue Ortho Device/Splint Interventions: Ordered,Application,Adjustment   Post Interventions Patient Tolerated: Well Instructions Provided: Care of device,Poper ambulation with device   Tiffany Talarico 07/31/2020, 4:31 AM

## 2020-07-31 NOTE — Discharge Instructions (Signed)
Motrin and tylenol as needed for pain. Ice affected area (see instructions below).  °Please call the orthopedic physician listed today or first thing in the morning to schedule a follow up appointment.  ° °Fractures generally take 4-6 weeks to heal. It is very important to keep your splint dry until your follow up with the orthopedic doctor and a cast can be applied. You may place a plastic bag around the extremity with the splint while bathing to keep it dry. Also try to sleep with the extremity elevated for the next several nights to decrease swelling. Check the fingertips and toes several times per day to make sure they are not cold, pale, or blue. If this is the case, the splint may be too tight and should return to the ER, your regular doctor or the orthopedist for recheck. Return to the ER for new or worsening symptoms, any additional concerns.  ° °COLD THERAPY DIRECTIONS:  °Ice or gel packs can be used to reduce both pain and swelling. Ice is the most helpful within the first 24 to 48 hours after an injury or flareup from overusing a muscle or joint.  Ice is effective, has very few side effects, and is safe for most people to use.  ° °If you expose your skin to cold temperatures for too long or without the proper protection, you can damage your skin or nerves. Watch for signs of skin damage due to cold.  ° °HOME CARE INSTRUCTIONS  °Follow these tips to use ice and cold packs safely.  °Place a dry or damp towel between the ice and skin. A damp towel will cool the skin more quickly, so you may need to shorten the time that the ice is used.  °For a more rapid response, add gentle compression to the ice.  °Ice for no more than 10 to 20 minutes at a time. The bonier the area you are icing, the less time it will take to get the benefits of ice.  °Check your skin after 5 minutes to make sure there are no signs of a poor response to cold or skin damage.  °Rest 20 minutes or more in between uses.  °Once your skin is  numb, you can end your treatment. You can test numbness by very lightly touching your skin. The touch should be so light that you do not see the skin dimple from the pressure of your fingertip. When using ice, most people will feel these normal sensations in this order: cold, burning, aching, and numbness.  ° °

## 2022-05-30 IMAGING — CR DG WRIST COMPLETE 3+V*R*
4 series · 4 of 4 positions shown · non-contrast
Comparison: None.

CLINICAL DATA: Fall, wrist pain

EXAM:
RIGHT WRIST - COMPLETE 3+ VIEW

[wrist pa]
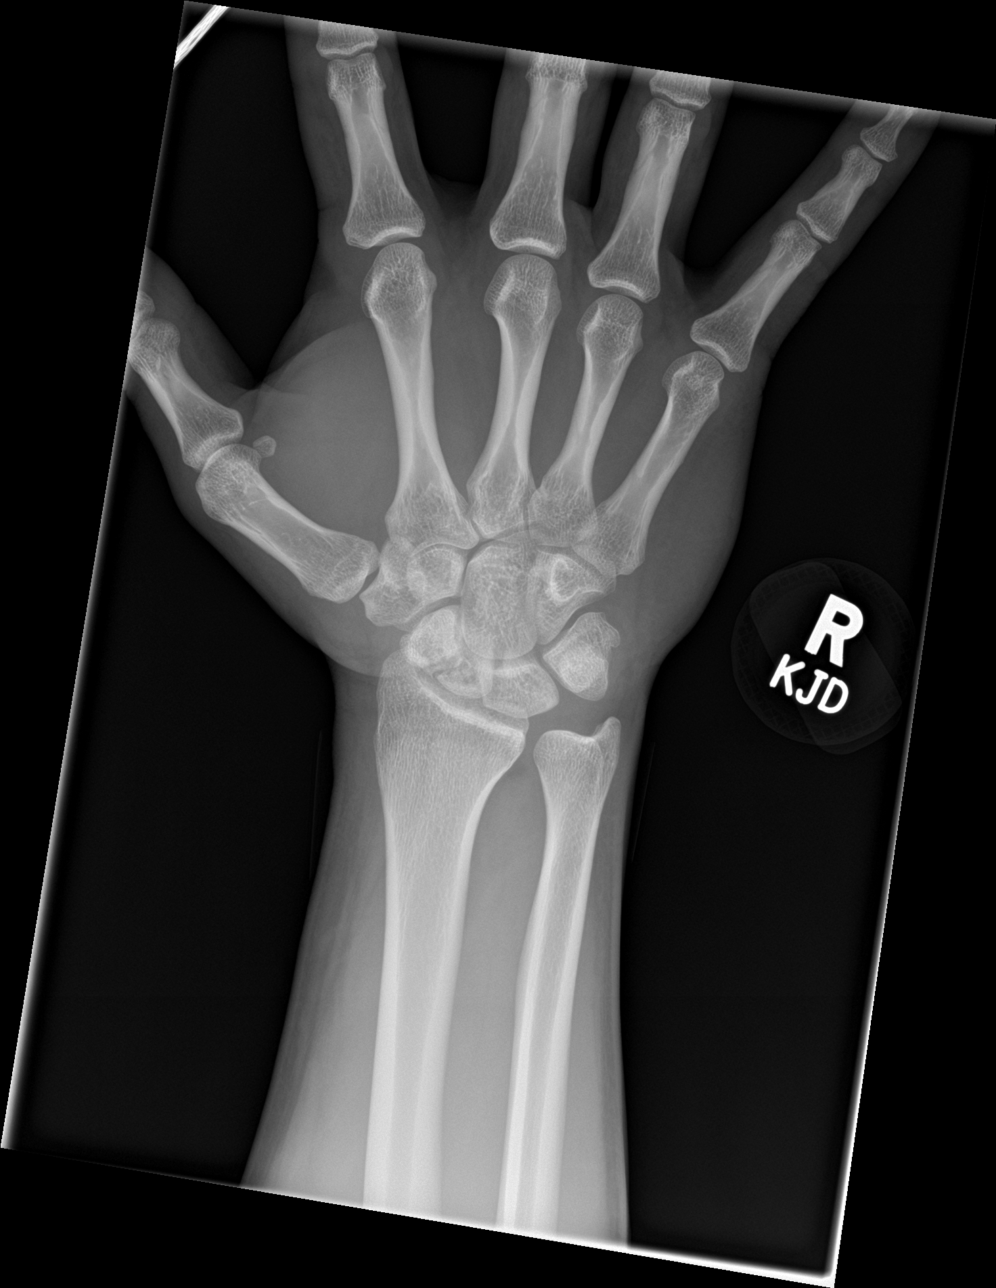

[wrist obl]
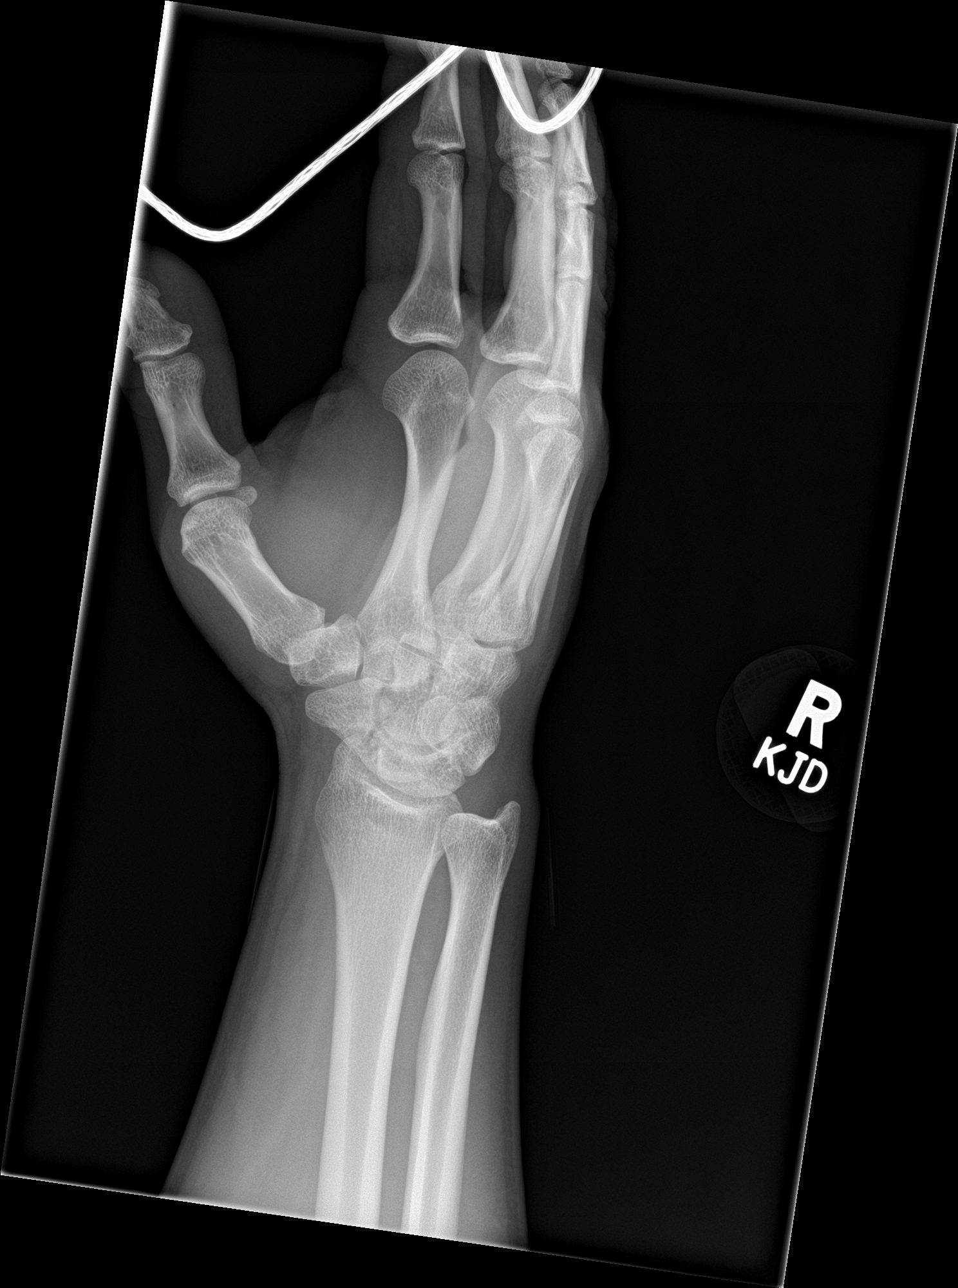

[wrist lat]
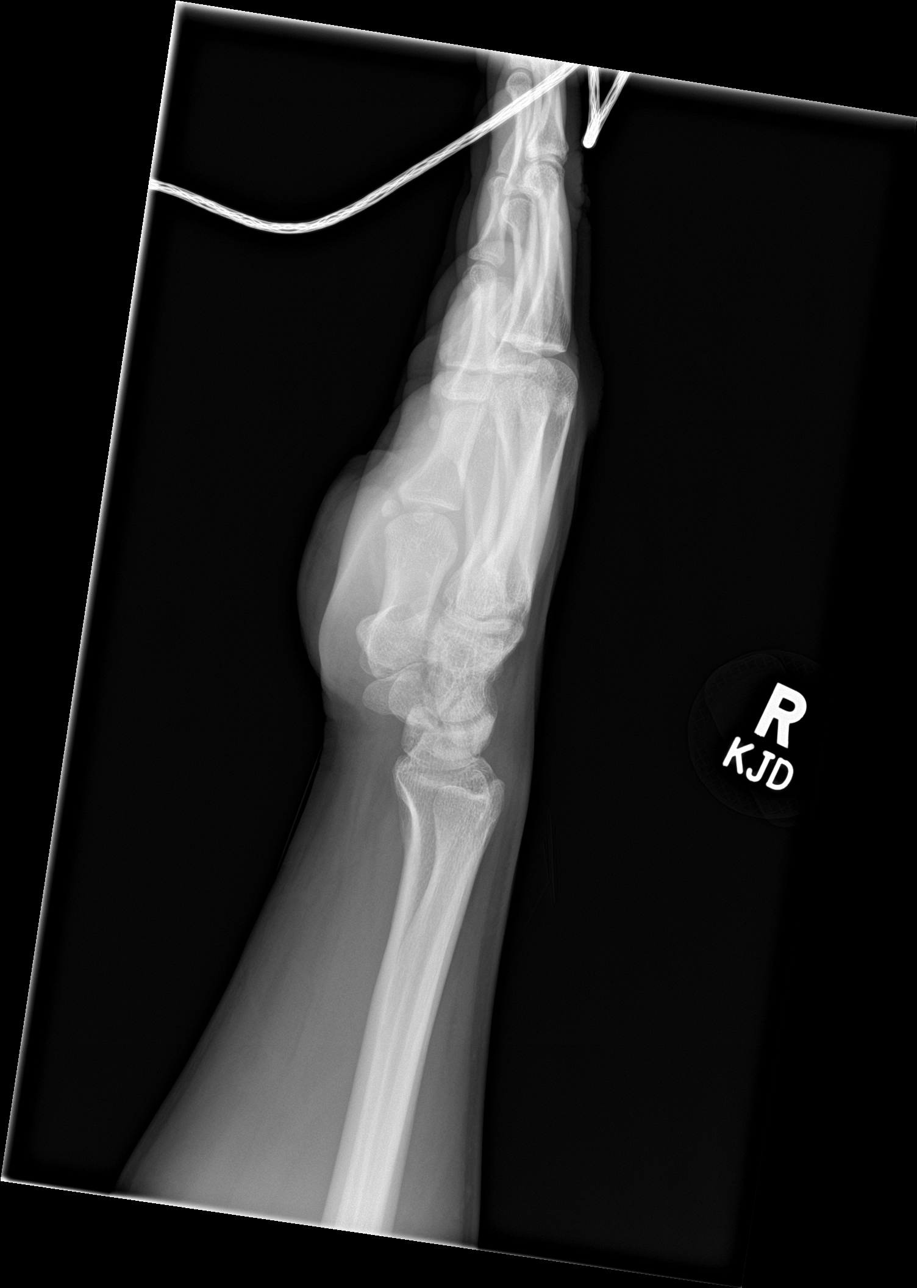

[wrist navicular]
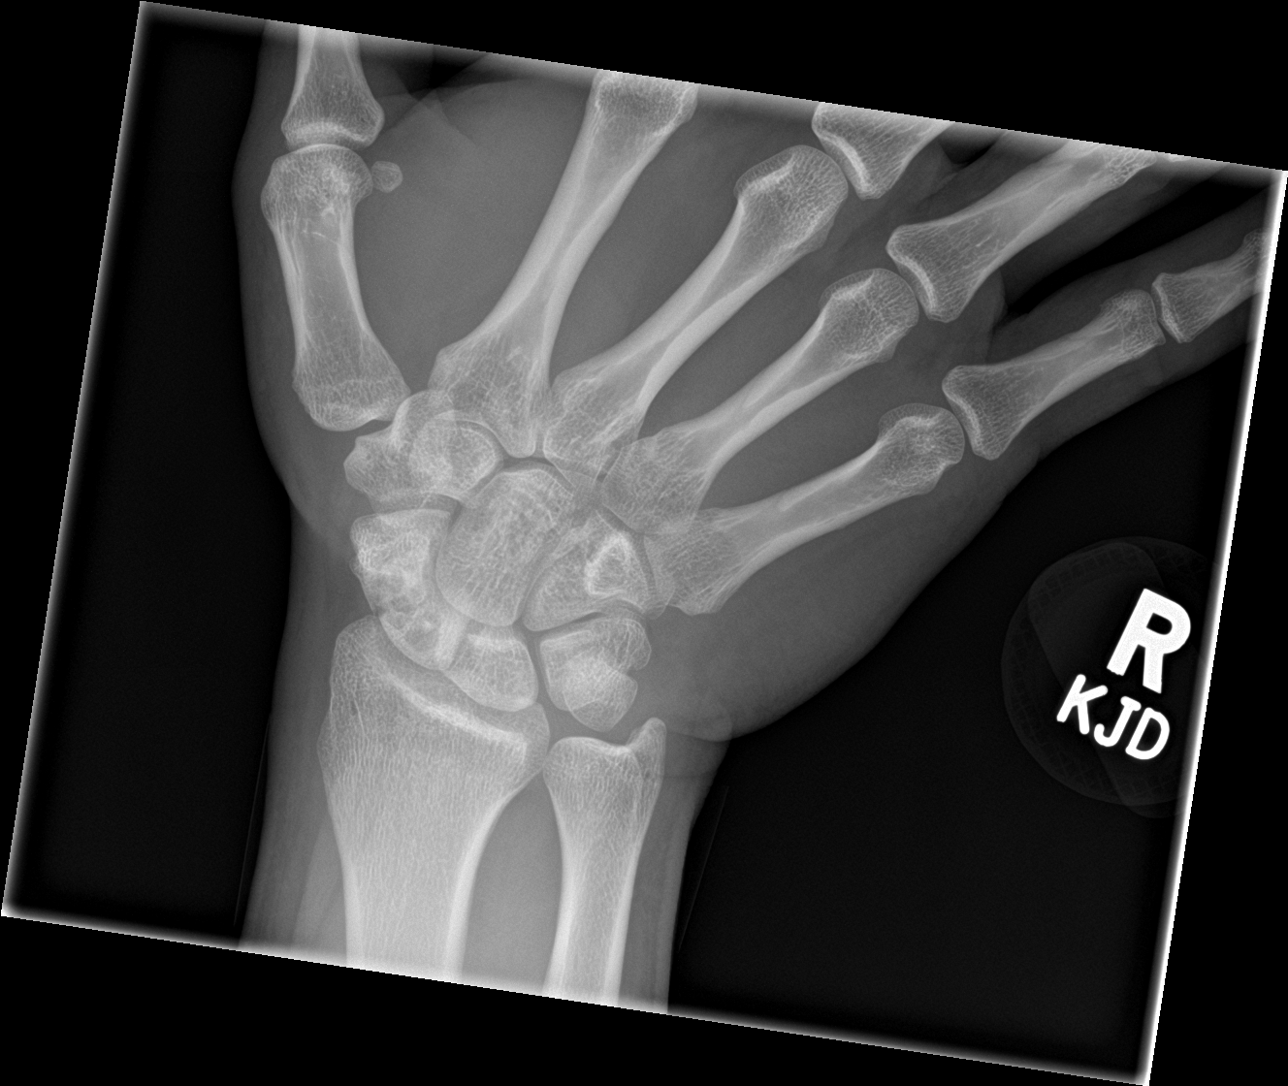

[4 of 4 positions shown; findings below may reference images not displayed]

FINDINGS: There is a fracture through the mid pole of the right scaphoid. No
subluxation or dislocation. No additional fracture. Soft tissues are
intact.
IMPRESSION: Midpole scaphoid fracture.
